# Patient Record
Sex: Female | Born: 1965 | Race: Black or African American | Hispanic: No | Marital: Married | Smoking: Never smoker
Health system: Southern US, Community
[De-identification: ages and names within clinical notes are randomized; demographics above are authoritative.]

## PROBLEM LIST (undated history)

## (undated) DIAGNOSIS — I340 Nonrheumatic mitral (valve) insufficiency: Secondary | ICD-10-CM

## (undated) DIAGNOSIS — R011 Cardiac murmur, unspecified: Secondary | ICD-10-CM

## (undated) DIAGNOSIS — R002 Palpitations: Secondary | ICD-10-CM

## (undated) HISTORY — DX: Nonrheumatic mitral (valve) insufficiency: I34.0

## (undated) HISTORY — PX: CHOLECYSTECTOMY: SHX55

## (undated) HISTORY — DX: Cardiac murmur, unspecified: R01.1

## (undated) HISTORY — PX: GALLBLADDER SURGERY: SHX652

## (undated) HISTORY — PX: VAGINAL HYSTERECTOMY: SUR661

## (undated) HISTORY — DX: Palpitations: R00.2

---

## 2006-05-05 ENCOUNTER — Emergency Department: Payer: Self-pay

## 2007-03-30 ENCOUNTER — Inpatient Hospital Stay: Payer: Self-pay | Admitting: Internal Medicine

## 2007-03-30 ENCOUNTER — Other Ambulatory Visit: Payer: Self-pay

## 2007-06-11 ENCOUNTER — Encounter: Admission: RE | Admit: 2007-06-11 | Discharge: 2007-06-11 | Payer: Self-pay | Admitting: Specialist

## 2007-11-21 ENCOUNTER — Ambulatory Visit: Payer: Self-pay | Admitting: Obstetrics and Gynecology

## 2008-05-08 ENCOUNTER — Emergency Department: Payer: Self-pay | Admitting: Emergency Medicine

## 2008-12-01 ENCOUNTER — Ambulatory Visit: Payer: Self-pay | Admitting: Obstetrics and Gynecology

## 2008-12-06 ENCOUNTER — Emergency Department: Payer: Self-pay | Admitting: Emergency Medicine

## 2009-01-30 HISTORY — PX: GASTRIC BYPASS: SHX52

## 2009-08-12 ENCOUNTER — Ambulatory Visit: Payer: Self-pay

## 2009-09-01 ENCOUNTER — Ambulatory Visit: Payer: Self-pay | Admitting: Bariatrics

## 2009-09-08 ENCOUNTER — Ambulatory Visit: Payer: Self-pay | Admitting: Bariatrics

## 2009-09-30 ENCOUNTER — Ambulatory Visit: Payer: Self-pay | Admitting: Bariatrics

## 2011-08-31 ENCOUNTER — Ambulatory Visit: Payer: Self-pay | Admitting: Internal Medicine

## 2011-08-31 DIAGNOSIS — I340 Nonrheumatic mitral (valve) insufficiency: Secondary | ICD-10-CM

## 2011-08-31 HISTORY — DX: Nonrheumatic mitral (valve) insufficiency: I34.0

## 2011-09-04 ENCOUNTER — Ambulatory Visit: Payer: Self-pay | Admitting: Internal Medicine

## 2011-09-20 ENCOUNTER — Ambulatory Visit: Payer: Self-pay | Admitting: Internal Medicine

## 2011-09-27 ENCOUNTER — Ambulatory Visit: Payer: Self-pay | Admitting: Internal Medicine

## 2011-10-18 ENCOUNTER — Encounter: Payer: Self-pay | Admitting: *Deleted

## 2011-10-27 ENCOUNTER — Ambulatory Visit (INDEPENDENT_AMBULATORY_CARE_PROVIDER_SITE_OTHER): Payer: 59 | Admitting: Cardiovascular Disease

## 2011-10-27 ENCOUNTER — Encounter: Payer: Self-pay | Admitting: Cardiovascular Disease

## 2011-10-27 VITALS — BP 124/82 | HR 68 | Ht 67.0 in | Wt 170.0 lb

## 2011-10-27 DIAGNOSIS — R002 Palpitations: Secondary | ICD-10-CM

## 2011-10-27 DIAGNOSIS — R0602 Shortness of breath: Secondary | ICD-10-CM

## 2011-10-27 DIAGNOSIS — R42 Dizziness and giddiness: Secondary | ICD-10-CM

## 2011-10-27 NOTE — Progress Notes (Signed)
   Patient ID: Tiffany Bishop, female    DOB: 01-16-1966, 46 y.o.   MRN: [id number]  HPI Comments: Tiffany Bishop is a pleasant 46 your old woman, patient of Dr. Alison Murray, with a history of obesity, gastric bypass surgery with significant weight loss, presenting for evaluation of chronic dizziness.  She reports that  she has been in good health and does power walking sometimes twice a day. She denies any shortness of breath or chest pain. She has dizziness that comes on lasting for seconds up to 1 minute. They happen either at rest or with exertion. It can happen when she is sitting down and sometimes lying down makes it better. Typically she has to stop what she is doing and sit until it passes. Since the initial onset, previously associated sometimes with headaches, her symptoms have been slowly getting better though have not resolved.  Echocardiogram was done that showed normal LV function, mild mitral valve regurgitation (mild to moderate in the details though mild MR and the summary ). She had a Holter monitor that showed normal sinus rhythm with APCs, PVCs Diary did not show arrhythmia   EKG today shows normal sinus rhythm with rate 68 beats per minute with no significant ST or T wave changes poor R wave progression to the anterior precordial leads .    Outpatient Encounter Prescriptions as of 10/27/2011  Medication Sig Dispense Refill  . meloxicam (MOBIC) 15 MG tablet Take 15 mg by mouth daily.      . Multiple Vitamin (MULTIVITAMIN) capsule Take 1 capsule by mouth daily.        Review of Systems  Constitutional: Negative.   HENT: Negative.   Eyes: Negative.   Respiratory: Negative.   Cardiovascular: Negative.   Gastrointestinal: Negative.   Musculoskeletal: Negative.   Skin: Negative.   Neurological: Positive for dizziness.  Hematological: Negative.   Psychiatric/Behavioral: Negative.   All other systems reviewed and are negative.    BP 124/82  Pulse 68  Ht 5\' 7"   (1.702 m)  Wt 170 lb (77.111 kg)  BMI 26.63 kg/m2  Physical Exam  Nursing note and vitals reviewed. Constitutional: She is oriented to person, place, and time. She appears well-developed and well-nourished.  HENT:  Head: Normocephalic.  Nose: Nose normal.  Mouth/Throat: Oropharynx is clear and moist.  Eyes: Conjunctivae normal are normal. Pupils are equal, round, and reactive to light.  Neck: Normal range of motion. Neck supple. No JVD present.  Cardiovascular: Normal rate, regular rhythm, S1 normal, S2 normal, normal heart sounds and intact distal pulses.  Exam reveals no gallop and no friction rub.   No murmur heard. Pulmonary/Chest: Effort normal and breath sounds normal. No respiratory distress. She has no wheezes. She has no rales. She exhibits no tenderness.  Abdominal: Soft. Bowel sounds are normal. She exhibits no distension. There is no tenderness.  Musculoskeletal: Normal range of motion. She exhibits no edema and no tenderness.  Lymphadenopathy:    She has no cervical adenopathy.  Neurological: She is alert and oriented to person, place, and time. Coordination normal.  Skin: Skin is warm and dry. No rash noted. No erythema.  Psychiatric: She has a normal mood and affect. Her behavior is normal. Judgment and thought content normal.         Assessment and Plan

## 2011-10-27 NOTE — Patient Instructions (Addendum)
You are doing well. No medication changes were made.  We will order a 48 hour monitor. This can be continued for up to 30 days  Please call us if you have new issues that need to be addressed before your next appt.

## 2011-10-27 NOTE — Assessment & Plan Note (Addendum)
Etiology of her symptoms is not clear. Testing so far has been normal including echocardiogram and 24-hour Holter. She is concerned about cardiac rhythm issues. We have suggested she repeat a Holter with 48-hour monitoring. If she is unable to capture her symptoms while wearing a monitor, 30 day monitor could be arranged.  If this is normal in the setting of symptoms (and we have suggested she keep a diary), no further cardiac workup would be needed at this time. Symptoms come on at rest, not associated with exertion and less likely ischemia. At that point, she may benefit from neurology or ear nose throat for possible vestibular issues.

## 2011-11-06 DIAGNOSIS — R002 Palpitations: Secondary | ICD-10-CM

## 2011-12-21 ENCOUNTER — Telehealth: Payer: Self-pay | Admitting: Cardiovascular Disease

## 2011-12-21 NOTE — Telephone Encounter (Signed)
Pt calling states she never got results from [facility].

## 2011-12-22 NOTE — Telephone Encounter (Signed)
Pt informed Understanding verb 

## 2011-12-22 NOTE — Telephone Encounter (Signed)
Placed results in dr. Windell Hummingbird box

## 2011-12-22 NOTE — Telephone Encounter (Signed)
Essentially normal monitor No significant arrhythmia noted Heart rate is in a reasonable range on all days tested No significant ectopy

## 2012-01-01 DIAGNOSIS — R002 Palpitations: Secondary | ICD-10-CM

## 2012-01-04 ENCOUNTER — Other Ambulatory Visit: Payer: Self-pay | Admitting: Cardiovascular Disease

## 2012-01-04 DIAGNOSIS — R002 Palpitations: Secondary | ICD-10-CM

## 2012-01-04 DIAGNOSIS — R42 Dizziness and giddiness: Secondary | ICD-10-CM

## 2012-01-18 ENCOUNTER — Ambulatory Visit: Payer: Self-pay | Admitting: Specialist

## 2012-01-26 ENCOUNTER — Ambulatory Visit: Payer: Self-pay | Admitting: Specialist

## 2012-03-16 ENCOUNTER — Other Ambulatory Visit: Payer: Self-pay

## 2012-10-11 ENCOUNTER — Ambulatory Visit: Payer: 59 | Admitting: Adult Health

## 2012-12-05 ENCOUNTER — Other Ambulatory Visit: Payer: Self-pay

## 2013-03-26 ENCOUNTER — Ambulatory Visit: Payer: Self-pay | Admitting: Podiatry

## 2013-08-19 ENCOUNTER — Encounter (HOSPITAL_COMMUNITY): Payer: Self-pay | Admitting: Emergency Medicine

## 2013-08-19 ENCOUNTER — Emergency Department (HOSPITAL_COMMUNITY): Payer: 59

## 2013-08-19 ENCOUNTER — Emergency Department (HOSPITAL_COMMUNITY)
Admission: EM | Admit: 2013-08-19 | Discharge: 2013-08-19 | Disposition: A | Discharge status: Home / Self Care | Payer: 59 | Attending: Emergency Medicine | Admitting: Emergency Medicine

## 2013-08-19 DIAGNOSIS — R011 Cardiac murmur, unspecified: Secondary | ICD-10-CM | POA: Insufficient documentation

## 2013-08-19 DIAGNOSIS — R071 Chest pain on breathing: Secondary | ICD-10-CM | POA: Insufficient documentation

## 2013-08-19 DIAGNOSIS — R0789 Other chest pain: Secondary | ICD-10-CM

## 2013-08-19 LAB — COMPREHENSIVE METABOLIC PANEL
ALT: 64 U/L — ABNORMAL HIGH (ref 0–35)
AST: 33 U/L (ref 0–37)
Albumin: 3.7 g/dL (ref 3.5–5.2)
Alkaline Phosphatase: 73 U/L (ref 39–117)
Anion gap: 13 (ref 5–15)
BILIRUBIN TOTAL: 0.3 mg/dL (ref 0.3–1.2)
BUN: 9 mg/dL (ref 6–23)
CALCIUM: 8.9 mg/dL (ref 8.4–10.5)
CHLORIDE: 103 meq/L (ref 96–112)
CO2: 23 meq/L (ref 19–32)
Creatinine, Ser: 0.6 mg/dL (ref 0.50–1.10)
GFR calc Af Amer: >90 mL/min (ref >90)
GFR calc non Af Amer: >90 mL/min (ref >90)
Glucose, Bld: 83 mg/dL (ref 70–99)
POTASSIUM: 4.3 meq/L (ref 3.7–5.3)
Sodium: 139 mEq/L (ref 137–147)
Total Protein: 6.7 g/dL (ref 6.0–8.3)

## 2013-08-19 LAB — TROPONIN I

## 2013-08-19 LAB — CBC
HEMATOCRIT: 36.3 % (ref 36.0–46.0)
HEMOGLOBIN: 12 g/dL (ref 12.0–15.0)
MCH: 28 pg (ref 26.0–34.0)
MCHC: 33.1 g/dL (ref 30.0–36.0)
MCV: 84.6 fL (ref 78.0–100.0)
Platelets: 255 10*3/uL (ref 150–400)
RBC: 4.29 MIL/uL (ref 3.87–5.11)
RDW: 13.3 % (ref 11.5–15.5)
WBC: 5.5 10*3/uL (ref 4.0–10.5)

## 2013-08-19 LAB — I-STAT TROPONIN, ED: Troponin i, poc: 0 ng/mL (ref 0.00–0.08)

## 2013-08-19 LAB — D-DIMER, QUANTITATIVE (NOT AT ARMC)

## 2013-08-19 LAB — LIPASE, BLOOD: Lipase: 18 U/L (ref 11–59)

## 2013-08-19 MED ORDER — CYCLOBENZAPRINE HCL 5 MG PO TABS: 5.0000 mg | ORAL_TABLET | Freq: Three times a day (TID) | ORAL | Status: DC | PRN

## 2013-08-19 MED ORDER — KETOROLAC TROMETHAMINE 30 MG/ML IJ SOLN
30.0000 mg | Freq: Once | INTRAMUSCULAR | Status: AC
  Administered 2013-08-19: 30 mg via INTRAVENOUS
  Filled 2013-08-19: qty 1

## 2013-08-19 MED ORDER — NAPROXEN 500 MG PO TABS: 500.0000 mg | ORAL_TABLET | Freq: Two times a day (BID) | ORAL | Status: DC

## 2013-08-19 MED ORDER — DIAZEPAM 5 MG/ML IJ SOLN
5.0000 mg | Freq: Once | INTRAMUSCULAR | Status: AC
  Administered 2013-08-19: 5 mg via INTRAVENOUS
  Filled 2013-08-19: qty 2

## 2013-08-19 MED ORDER — ACETAMINOPHEN 500 MG PO TABS
1000.0000 mg | ORAL_TABLET | Freq: Once | ORAL | Status: AC
  Administered 2013-08-19: 1000 mg via ORAL
  Filled 2013-08-19: qty 2

## 2013-08-19 NOTE — ED Notes (Signed)
Saa, Phlebotomy at the bedside.  

## 2013-08-19 NOTE — ED Notes (Signed)
To [facility] via w/c

## 2013-08-19 NOTE — ED Notes (Signed)
Per [facility]: Patient began having symptoms this AM at work. Patient states she was cold, diaphoretic, and having centralized non radiating chest pain. Pain on a scale of 8 out of 10. Denies N/V. Patient experienced some SOB. Clear lung sounds, denies significant medical history, allergies, or current medications. EMS administered 1 NTG, decreased pain from 8 to 5. 324 mg ASA. Placed on O2. 20 g to L AC. VSS.

## 2013-08-19 NOTE — Discharge Instructions (Signed)
Drink plenty of fluids. Take the prescribed medications for your chest wall soreness. Use ice and heat for the pain. Recheck if you get a fever, cough, struggle to breathe or seem worse.

## 2013-08-19 NOTE — ED Provider Notes (Signed)
CSN: [id number]     Arrival date & time 08/19/13  1005 History   First MD Initiated Contact with Patient 08/19/13 1037     Chief Complaint  Patient presents with  . 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 She states it is a very stressful job. She states today she felt fine when she went to work. About 8:15 she started having chest pain. She took one Excedrin however her chest started getting tighter and she started feeling short of breath. She then got very sweaty and started feeling hot. She had nausea without vomiting. She states about 8:20 she had a normal bowel movement however this did not improve her discomfort. She states her chest is sore to touch. She states taking a deep breath or moving her arms makes the pain worse. She states resting makes it feel better. She states she's never had this pain before. She denies any change in her activity. Patient states her pain currently as a 6/10, at its worst it was an 8/10. EMS gave her aspirin and a nitroglycerin. It did seem to temporarily improve her pain.  Family history patient states her father who is [id number] has an enlarged heart otherwise negative for coronary artery disease.  PCP Dr Lyndel PleasureErica Howard in [place]  Past Medical History  Diagnosis Date  . Mitral regurgitation 08/2011    mild to moderate from an Echo  . Heart murmur   . Palpitation    Past Surgical History  Procedure Laterality Date  . Gastric bypass  2011  . Vaginal hysterectomy    . Gallbladder surgery    . Cholecystectomy     Family History  Problem Relation Age of Onset  . Heart failure Father   . Hyperlipidemia Father   . Hypertension Father    History  Substance Use Topics  . Smoking status: Never Smoker   . Smokeless tobacco: Not on file  . Alcohol Use: No   Employed at [employer] in call center   OB History   Grav Para  Term Preterm Abortions TAB SAB Ect Mult Living                 Review of Systems  All other systems reviewed and are negative.     Allergies  Review of patient's allergies indicates no known allergies.  Home Medications   Prior to Admission medications   Medication Sig Start Date End Date Taking? Authorizing Provider  Multiple Vitamin (MULTIVITAMIN) capsule Take 1 capsule by mouth daily.   Yes Historical Provider, MD   BP 109/64  Pulse 66  Temp(Src) 98.1 F (36.7 C) (Oral)  Resp 15  Ht 5\' 6"  (1.676 m)  Wt 178 lb (80.74 kg)  BMI 28.74 kg/m2  SpO2 100%  Vital signs normal   Physical Exam  Nursing note and vitals reviewed. Constitutional: She is oriented to person, place, and time. She appears well-developed and well-nourished.  Non-toxic appearance. She does not appear ill. No distress.  HENT:  Head: Normocephalic and atraumatic.  Right Ear: External ear normal.  Left Ear: External ear normal.  Nose: Nose normal. No mucosal edema or rhinorrhea.  Mouth/Throat: Oropharynx is clear and moist and mucous membranes are normal. No dental abscesses or uvula swelling.  Eyes: Conjunctivae and EOM are normal. Pupils are equal, round, and reactive to light.  Neck: Normal range of motion and full passive range of motion without  pain. Neck supple.  Cardiovascular: Normal rate, regular rhythm and normal heart sounds.  Exam reveals no gallop and no friction rub.   No murmur heard. Pulmonary/Chest: Effort normal and breath sounds normal. No respiratory distress. She has no wheezes. She has no rhonchi. She has no rales. She exhibits tenderness. She exhibits no crepitus.    Patient has diffuse chest wall tenderness of her chest.  Abdominal: Soft. Normal appearance and bowel sounds are normal. She exhibits no distension. There is no tenderness. There is no rebound and no guarding.  Musculoskeletal: Normal range of motion. She exhibits no edema and no tenderness.  Moves all extremities  well.   Neurological: She is alert and oriented to person, place, and time. She has normal strength. No cranial nerve deficit.  Skin: Skin is warm, dry and intact. No rash noted. No erythema. No pallor.  Psychiatric: She has a normal mood and affect. Her speech is normal and behavior is normal. Her mood appears not anxious.    ED Course  Procedures (including critical care time)  Medications  ketorolac (TORADOL) 30 MG/ML injection 30 mg (30 mg Intravenous Given 08/19/13 1131)  diazepam (VALIUM) injection 5 mg (5 mg Intravenous Given 08/19/13 1132)  acetaminophen (TYLENOL) tablet 1,000 mg (1,000 mg Oral Given 08/19/13 1331)   Recheck at 1320 patient states her chest wall pain is improved. She is not able to breathe deeply and move her arms without pain. She however states she has a headache from the nitroglycerin given to her by EMS. We've reviewed all her lab test results. We discussed her following up with her PCP about the mild elevation of her SGPT. She is not on any medications such as a anti-cholesterol medicine that could be doing this. We discussed getting a second troponin and hopefully if it is normal she will be discharged.  14:50 Second troponin is negative, pt is being discharged with chest wall pain.    Labs Review Results for orders placed during the hospital encounter of 08/19/13  CBC      Result Value Ref Range   WBC 5.5  4.0 - 10.5 K/uL   RBC 4.29  3.87 - 5.11 MIL/uL   Hemoglobin 12.0  12.0 - 15.0 g/dL   HCT 16.1  09.6 - 04.5 %   MCV 84.6  78.0 - 100.0 fL   MCH 28.0  26.0 - 34.0 pg   MCHC 33.1  30.0 - 36.0 g/dL   RDW 40.9  81.1 - 91.4 %   Platelets 255  150 - 400 K/uL  TROPONIN I      Result Value Ref Range   Troponin I <0.30  <0.30 ng/mL  COMPREHENSIVE METABOLIC PANEL      Result Value Ref Range   Sodium 139  137 - 147 mEq/L   Potassium 4.3  3.7 - 5.3 mEq/L   Chloride 103  96 - 112 mEq/L   CO2 23  19 - 32 mEq/L   Glucose, Bld 83  70 - 99 mg/dL   BUN 9  6 - 23  mg/dL   Creatinine, Ser 7.82  0.50 - 1.10 mg/dL   Calcium 8.9  8.4 - 95.6 mg/dL   Total Protein 6.7  6.0 - 8.3 g/dL   Albumin 3.7  3.5 - 5.2 g/dL   AST 33  0 - 37 U/L   ALT 64 (*) 0 - 35 U/L   Alkaline Phosphatase 73  39 - 117 U/L   Total Bilirubin 0.3  0.3 -  1.2 mg/dL   GFR calc non Af Amer >90  >90 mL/min   GFR calc Af Amer >90  >90 mL/min   Anion gap 13  5 - 15  LIPASE, BLOOD      Result Value Ref Range   Lipase 18  11 - 59 U/L  D-DIMER, QUANTITATIVE      Result Value Ref Range   D-Dimer, Quant <0.27  0.00 - 0.48 ug/mL-FEU  I-STAT TROPOININ, ED      Result Value Ref Range   Troponin i, poc 0.00  0.00 - 0.08 ng/mL   Comment 3            Laboratory interpretation all normal except elevation of the SGPT    Imaging Review Dg Chest 2 View  08/19/2013   CLINICAL DATA:  Shortness of breath, chest pain  EXAM: CHEST  2 VIEW  COMPARISON:  None.  FINDINGS: The heart size and mediastinal contours are within normal limits. Both lungs are clear. The visualized skeletal structures are unremarkable.  IMPRESSION: No active cardiopulmonary disease.   Electronically Signed   By: Elige Ko   On: 08/19/2013 11:10     EKG Interpretation   Date/Time:  Tuesday August 19 2013 10:12:48 EDT Ventricular Rate:  61 PR Interval:  139 QRS Duration: 99 QT Interval:  399 QTC Calculation: 402 R Axis:   -36 Text Interpretation:  Sinus rhythm Left axis deviation Low voltage,  extremity and precordial leads RSR' in V1 or V2, probably normal variant  Consider anterior infarct No old tracing to compare Confirmed by Faatima Tench   MD-I, Taletha Twiford ([id number]) on 08/19/2013 10:33:31 AM      MDM   Final diagnoses:  Chest wall pain   New Prescriptions   CYCLOBENZAPRINE (FLEXERIL) 5 MG TABLET    Take 1 tablet (5 mg total) by mouth 3 (three) times daily as needed (muscle soreness).   NAPROXEN (NAPROSYN) 500 MG TABLET    Take 1 tablet (500 mg total) by mouth 2 (two) times daily.    Plan discharge   Devoria Albe, MD,  Franz Dell, MD 08/19/13 [phone]

## 2013-08-19 NOTE — ED Notes (Signed)
Dr. Knapp at the bedside.  

## 2014-09-20 ENCOUNTER — Encounter: Payer: Self-pay | Admitting: Emergency Medicine

## 2014-09-20 ENCOUNTER — Emergency Department
Admission: EM | Admit: 2014-09-20 | Discharge: 2014-09-20 | Disposition: A | Discharge status: Home / Self Care | Payer: BC Managed Care – PPO | Attending: Emergency Medicine | Admitting: Emergency Medicine

## 2014-09-20 ENCOUNTER — Emergency Department: Payer: BC Managed Care – PPO

## 2014-09-20 DIAGNOSIS — Y9389 Activity, other specified: Secondary | ICD-10-CM | POA: Insufficient documentation

## 2014-09-20 DIAGNOSIS — Y998 Other external cause status: Secondary | ICD-10-CM | POA: Diagnosis not present

## 2014-09-20 DIAGNOSIS — S060X0A Concussion without loss of consciousness, initial encounter: Secondary | ICD-10-CM | POA: Insufficient documentation

## 2014-09-20 DIAGNOSIS — S0083XA Contusion of other part of head, initial encounter: Secondary | ICD-10-CM | POA: Insufficient documentation

## 2014-09-20 DIAGNOSIS — Y92410 Unspecified street and highway as the place of occurrence of the external cause: Secondary | ICD-10-CM | POA: Diagnosis not present

## 2014-09-20 DIAGNOSIS — F0781 Postconcussional syndrome: Secondary | ICD-10-CM

## 2014-09-20 DIAGNOSIS — S0990XA Unspecified injury of head, initial encounter: Secondary | ICD-10-CM | POA: Diagnosis present

## 2014-09-20 MED ORDER — HYDROCODONE-ACETAMINOPHEN 5-325 MG PO TABS
1.0000 | ORAL_TABLET | Freq: Once | ORAL | Status: AC
  Administered 2014-09-20: 1 via ORAL
  Filled 2014-09-20: qty 1

## 2014-09-20 MED ORDER — HYDROCODONE-ACETAMINOPHEN 5-325 MG PO TABS: 1.0000 | ORAL_TABLET | ORAL | Status: DC | PRN

## 2014-09-20 NOTE — Discharge Instructions (Signed)
Concussion °A concussion is a brain injury. It is caused by: °· A hit to the head. °· A quick and sudden movement (jolt) of the head or neck. °A concussion is usually not life threatening. Even so, it can cause serious problems. If you had a concussion before, you may have concussion-like problems after a hit to your head. °HOME CARE °General Instructions °· Follow your doctor's directions carefully. °· Take medicines only as told by your doctor. °· Only take medicines your doctor says are safe. °· Do not drink alcohol until your doctor says it is okay. Alcohol and some drugs can slow down healing. They can also put you at risk for further injury. °· If you are having trouble remembering things, write them down. °· Try to do one thing at a time if you get distracted easily. For example, do not watch TV while making dinner. °· Talk to your family members or close friends when making important decisions. °· Follow up with your doctor as told. °· Watch your symptoms. Tell others to do the same. Serious problems can sometimes happen after a concussion. Older adults are more likely to have these problems. °· Tell your teachers, school nurse, school counselor, coach, athletic trainer, or work manager about your concussion. Tell them about what you can or cannot do. They should watch to see if: °¨ It gets even harder for you to pay attention or concentrate. °¨ It gets even harder for you to remember things or learn new things. °¨ You need more time than normal to finish things. °¨ You become annoyed (irritable) more than before. °¨ You are not able to deal with stress as well. °¨ You have more problems than before. °· Rest. Make sure you: °¨ Get plenty of sleep at night. °¨ Go to sleep early. °¨ Go to bed at the same time every day. Try to wake up at the same time. °¨ Rest during the day. °¨ Take naps when you feel tired. °· Limit activities where you have to think a lot or concentrate. These include: °¨ Doing  homework. °¨ Doing work related to a job. °¨ Watching TV. °¨ Using the computer. °Returning To Your Regular Activities °Return to your normal activities slowly, not all at once. You must give your body and brain enough time to heal.  °· Do not play sports or do other athletic activities until your doctor says it is okay. °· Ask your doctor when you can drive, ride a bicycle, or work other vehicles or machines. Never do these things if you feel dizzy. °· Ask your doctor about when you can return to work or school. °Preventing Another Concussion °It is very important to avoid another brain injury, especially before you have healed. In rare cases, another injury can lead to permanent brain damage, brain swelling, or death. The risk of this is greatest during the first 7-10 days after your injury. Avoid injuries by:  °· Wearing a seat belt when riding in a car. °· Not drinking too much alcohol. °· Avoiding activities that could lead to a second concussion (such as contact sports). °· Wearing a helmet when doing activities like: °¨ Biking. °¨ Skiing. °¨ Skateboarding. °¨ Skating. °· Making your home safer by: °¨ Removing things from the floor or stairways that could make you trip. °¨ Using grab bars in bathrooms and handrails by stairs. °¨ Placing non-slip mats on floors and in bathtubs. °¨ Improve lighting in dark areas. °GET HELP IF: °· It   gets even harder for you to pay attention or concentrate.  It gets even harder for you to remember things or learn new things.  You need more time than normal to finish things.  You become annoyed (irritable) more than before.  You are not able to deal with stress as well.  You have more problems than before.  You have problems keeping your balance.  You are not able to react quickly when you should. Get help if you have any of these problems for more than 2 weeks:   Lasting (chronic) headaches.  Dizziness or trouble balancing.  Feeling sick to your stomach  (nausea).  Seeing (vision) problems.  Being affected by noises or light more than normal.  Feeling sad, low, down in the dumps, blue, gloomy, or empty (depressed).  Mood changes (mood swings).  Feeling of fear or nervousness about what may happen (anxiety).  Feeling annoyed.  Memory problems.  Problems concentrating or paying attention.  Sleep problems.  Feeling tired all the time. GET HELP RIGHT AWAY IF:   You have bad headaches or your headaches get worse.  You have weakness (even if it is in one hand, leg, or part of the face).  You have loss of feeling (numbness).  You feel off balance.  You keep throwing up (vomiting).  You feel tired.  One black center of your eye (pupil) is larger than the other.  You twitch or shake violently (convulse).  Your speech is not clear (slurred).  You are more confused, easily angered (agitated), or annoyed than before.  You have more trouble resting than before.  You are unable to recognize people or places.  You have neck pain.  It is difficult to wake you up.  You have unusual behavior changes.  You pass out (lose consciousness). MAKE SURE YOU:   Understand these instructions.  Will watch your condition.  Will get help right away if you are not doing well or get worse. Document Released: 01/04/2009 Document Revised: 06/02/2013 Document Reviewed: 08/08/2012 [facility] Patient Information 2015 [place], [place]. This information is not intended to replace advice given to you by your health care provider. Make sure you discuss any questions you have with your health care provider.    FOLLOW UP WITH YOUR DOCTOR AT [facility] ABOUT THE 3 CM MENINGIOMA THAT WE DISCUSSED.  THIS IS NOT A RESULT OF YOUR ACCIDENT [facility] FOR PAIN AS NEEDED ICE TO FOREHEAD AS NEEDED FOR PAIN AND SWELLING

## 2014-09-20 NOTE — ED Notes (Signed)
AAOx3.  Skin warm and dry.  Moving all extremities equally and strong.  Ambulates with easy and steady gait.  NAD 
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
## 2015-09-13 ENCOUNTER — Encounter: Payer: Self-pay | Admitting: Emergency Medicine

## 2015-09-13 ENCOUNTER — Emergency Department
Admission: EM | Admit: 2015-09-13 | Discharge: 2015-09-13 | Disposition: A | Discharge status: Home / Self Care | Payer: BC Managed Care – PPO | Attending: Emergency Medicine | Admitting: Emergency Medicine

## 2015-09-13 ENCOUNTER — Emergency Department: Payer: BC Managed Care – PPO

## 2015-09-13 DIAGNOSIS — R0789 Other chest pain: Secondary | ICD-10-CM | POA: Insufficient documentation

## 2015-09-13 DIAGNOSIS — R0602 Shortness of breath: Secondary | ICD-10-CM | POA: Diagnosis present

## 2015-09-13 DIAGNOSIS — R079 Chest pain, unspecified: Secondary | ICD-10-CM

## 2015-09-13 LAB — COMPREHENSIVE METABOLIC PANEL
ALBUMIN: 3.6 g/dL (ref 3.5–5.0)
ALK PHOS: 62 U/L (ref 38–126)
ALT: 21 U/L (ref 14–54)
ANION GAP: 6 (ref 5–15)
AST: 27 U/L (ref 15–41)
BILIRUBIN TOTAL: 0.5 mg/dL (ref 0.3–1.2)
BUN: 10 mg/dL (ref 6–20)
CALCIUM: 8.5 mg/dL — AB (ref 8.9–10.3)
CO2: 22 mmol/L (ref 22–32)
CREATININE: 0.55 mg/dL (ref 0.44–1.00)
Chloride: 110 mmol/L (ref 101–111)
GFR calc Af Amer: >60 mL/min (ref >60)
GFR calc non Af Amer: >60 mL/min (ref >60)
GLUCOSE: 110 mg/dL — AB (ref 65–99)
Potassium: 3.8 mmol/L (ref 3.5–5.1)
SODIUM: 138 mmol/L (ref 135–145)
TOTAL PROTEIN: 6.5 g/dL (ref 6.5–8.1)

## 2015-09-13 LAB — CBC
HEMATOCRIT: 38.3 % (ref 35.0–47.0)
HEMOGLOBIN: 13 g/dL (ref 12.0–16.0)
MCH: 28.6 pg (ref 26.0–34.0)
MCHC: 33.9 g/dL (ref 32.0–36.0)
MCV: 84.4 fL (ref 80.0–100.0)
Platelets: 258 10*3/uL (ref 150–440)
RBC: 4.54 MIL/uL (ref 3.80–5.20)
RDW: 14.2 % (ref 11.5–14.5)
WBC: 4.7 10*3/uL (ref 3.6–11.0)

## 2015-09-13 LAB — TROPONIN I: Troponin I: <0.03 ng/mL (ref <0.03)

## 2015-09-13 MED ORDER — IBUPROFEN 800 MG PO TABS
800.0000 mg | ORAL_TABLET | Freq: Once | ORAL | Status: AC
  Administered 2015-09-13: 800 mg via ORAL
  Filled 2015-09-13: qty 1

## 2015-09-13 MED ORDER — DIAZEPAM 5 MG PO TABS
5.0000 mg | ORAL_TABLET | Freq: Once | ORAL | Status: AC
  Administered 2015-09-13: 5 mg via ORAL
  Filled 2015-09-13: qty 1

## 2015-09-13 MED ORDER — LORAZEPAM 0.5 MG PO TABS: 0.5000 mg | ORAL_TABLET | Freq: Three times a day (TID) | ORAL | 0 refills | Status: AC | PRN

## 2015-09-13 NOTE — ED Notes (Signed)
Pt discharged home after verbalizing understanding of discharge instructions; nad noted. 

## 2015-09-13 NOTE — ED Triage Notes (Addendum)
Patient presents to the ED with chest pain that began around 4 am this morning.  Patient states she has been under a lot of stress lately and years ago she had similar pain caused by stress.  Patient reports some shortness of breath with the chest pain.  Patient is speaking in full sentences without obvious difficulty at this time.  Skin is warm and dry.

## 2015-09-13 NOTE — ED Provider Notes (Signed)
[facility] Emergency Department Provider Note        Time seen: ----------------------------------------- 7:19 AM on 09/13/2015 -----------------------------------------    I have reviewed the triage vital signs and the nursing notes.   HISTORY  Chief Complaint Chest Pain    HPI Tiffany LeventhalMichelle R Alridge is a 50 y.o. female who presents the ER with soreness across her chestand pain that began at 4 AM this morning. Patient states she's been under a lot of stress and years ago she had similar pain caused by stress. She reports some shortness of breath with the chest pain. She denies fevers, denies recent illness, denies risk factors for ACS.   Past Medical History:  Diagnosis Date  . Heart murmur   . Mitral regurgitation 08/2011   mild to moderate from an Echo  . Palpitation     Patient Active Problem List   Diagnosis Date Noted  . Dizziness 10/27/2011    Past Surgical History:  Procedure Laterality Date  . CHOLECYSTECTOMY    . GALLBLADDER SURGERY    . GASTRIC BYPASS  2011  . VAGINAL HYSTERECTOMY      Allergies Review of patient's allergies indicates no known allergies.  Social History Social History  Substance Use Topics  . Smoking status: Never Smoker  . Smokeless tobacco: Never Used  . Alcohol use No    Review of Systems Constitutional: Negative for fever. Cardiovascular: Positive for chest pain Respiratory: Negative for shortness of breath. Gastrointestinal: Negative for abdominal pain, vomiting and diarrhea. Genitourinary: Negative for dysuria. Musculoskeletal: Negative for back pain. Skin: Negative for rash. Neurological: Negative for headaches, focal weakness or numbness.  10-point ROS otherwise negative.  ____________________________________________   PHYSICAL EXAM:  VITAL SIGNS: ED Triage Vitals [09/13/15 0717]  Enc Vitals Group     BP      Pulse      Resp      Temp      Temp src      SpO2      Weight 190 lb  (86.2 kg)     Height 5\' 7"  (1.702 m)     Head Circumference      Peak Flow      Pain Score 6     Pain Loc      Pain Edu?      Excl. in GC?     Constitutional: Alert and oriented. Well appearing and in no distress. Eyes: Conjunctivae are normal. PERRL. Normal extraocular movements. ENT   Head: Normocephalic and atraumatic.   Nose: No congestion/rhinnorhea.   Mouth/Throat: Mucous membranes are moist.   Neck: No stridor. Cardiovascular: Normal rate, regular rhythm. No murmurs, rubs, or gallops. Respiratory: Normal respiratory effort without tachypnea nor retractions. Breath sounds are clear and equal bilaterally. No wheezes/rales/rhonchi. Gastrointestinal: Soft and nontender. Normal bowel sounds Musculoskeletal: Nontender with normal range of motion in all extremities. No lower extremity tenderness nor edema. Diffuse chest wall tenderness Neurologic:  Normal speech and language. No gross focal neurologic deficits are appreciated.  Skin:  Skin is warm, dry and intact. No rash noted. Psychiatric: Mood and affect are normal. Speech and behavior are normal.  ____________________________________________  EKG: Interpreted by me. Sinus rhythm with a rate of 71 bpm, normal PR interval, normal QRS, normal QT interval. Normal axis.  ____________________________________________  ED COURSE:  Pertinent labs & imaging results that were available during my care of the patient were reviewed by me and considered in my medical decision making (see chart for details). Clinical Course  Patient is in no distress, this is likely stress or muscle strain related. We'll check basic labs and imaging.  Procedures ____________________________________________   LABS (pertinent positives/negatives)  Labs Reviewed  COMPREHENSIVE METABOLIC PANEL - Abnormal; Notable for the following:       Result Value   Glucose, Bld 110 (*)    Calcium 8.5 (*)    All other components within normal limits  CBC   TROPONIN I    RADIOLOGY Images were viewed by me  Chest x-ray Is unremarkable ____________________________________________  FINAL ASSESSMENT AND PLAN  Chest pain  Plan: Patient with labs and imaging as dictated above. Patient's heart score is low risk for ACS. She does not have any risk factors. Labs are unremarkable, I'm confident this is stress related. I will discharge her with occasional Ativan to take as needed. She is stable for outpatient follow-up.   Tiffany Bishop, [name], MD   Note: This dictation was prepared with Dragon dictation. Any transcriptional errors that result from this process are unintentional    Tiffany FilbertJonathan E Chela Sutphen, MD 09/13/15 [phone]

## 2016-10-10 IMAGING — CR DG CHEST 2V
2 series · 2 of 2 positions shown · non-contrast
Comparison: PA and lateral chest x-ray August 19, 2013

CLINICAL DATA: Onset of chest pain and some shortness of breath
this morning, under considerable stress lately; similar episode
related to stress years ago. History of moderate mitral
regurgitation, nonsmoker.

EXAM:
CHEST  2 VIEW

[chest pa]
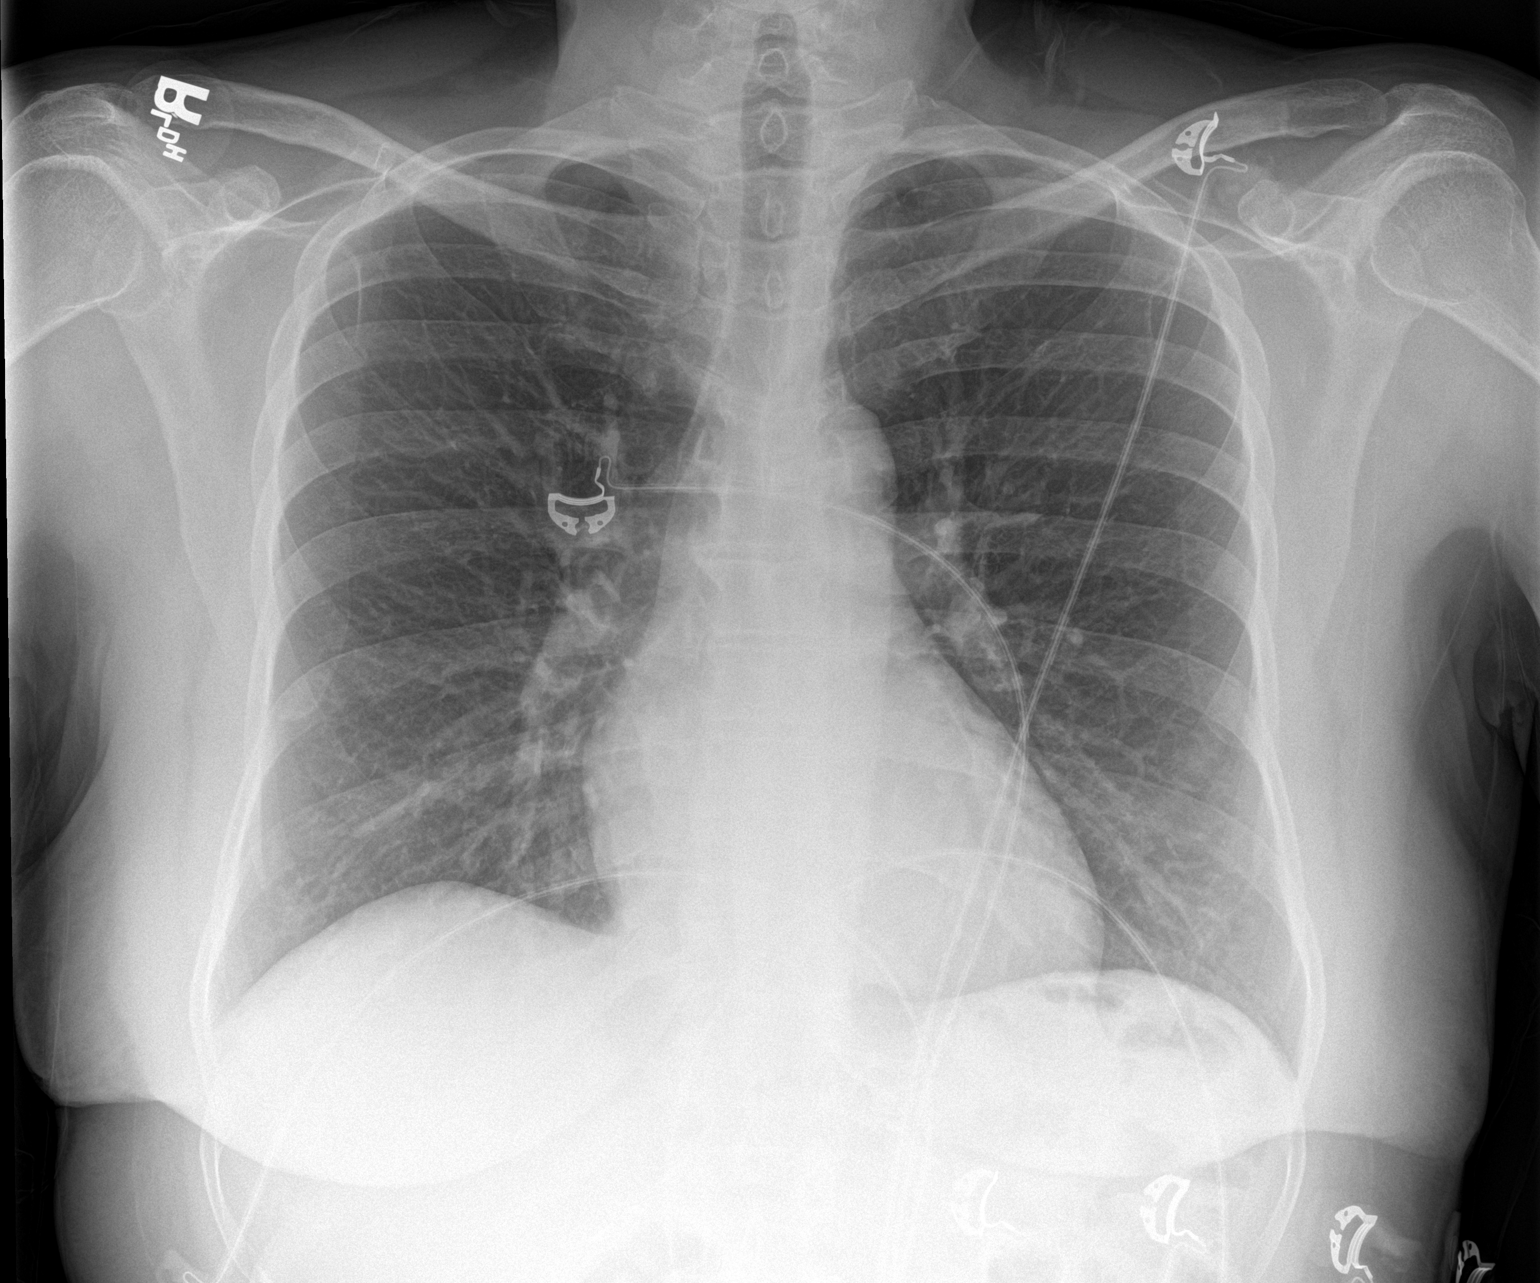

[chest lat]
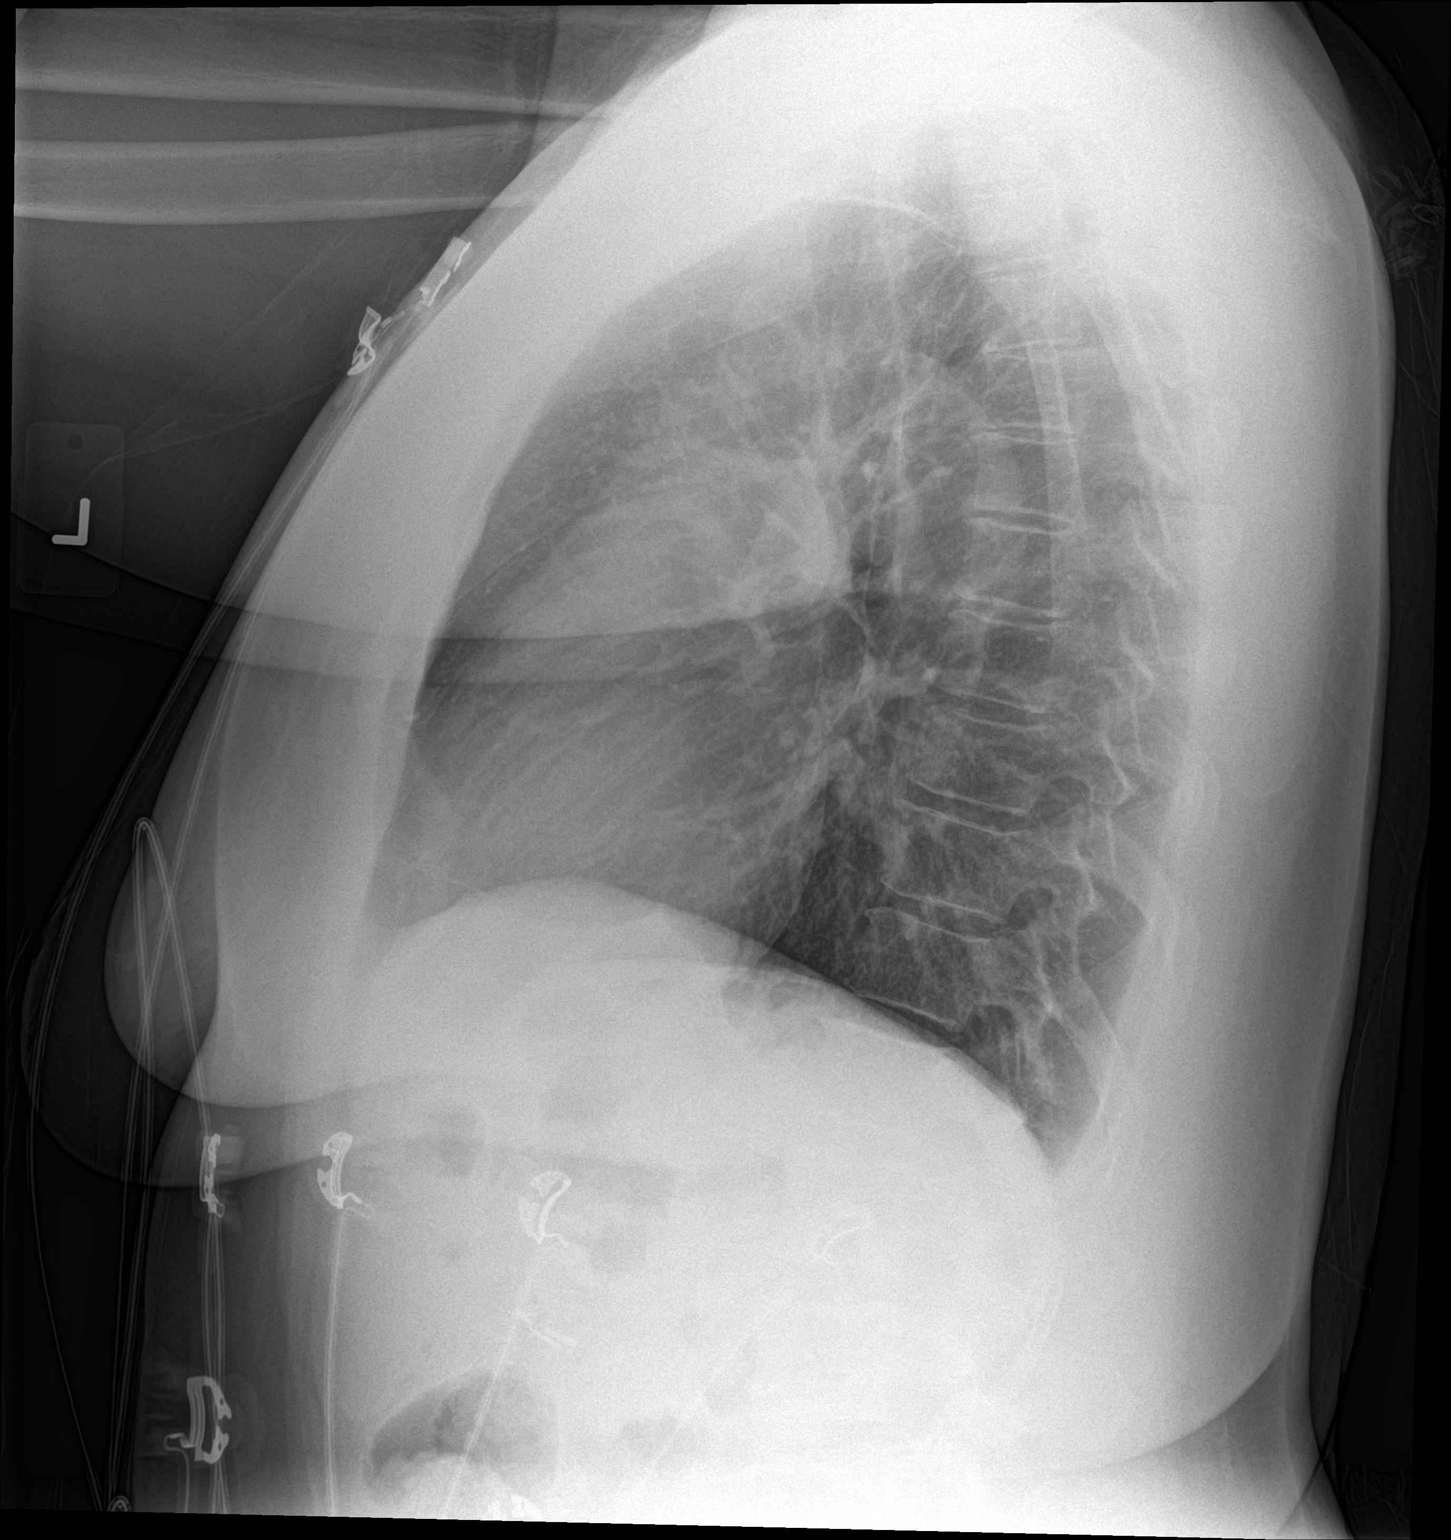

[2 of 2 positions shown; findings below may reference images not displayed]

FINDINGS: The lungs are well-expanded and clear. The heart and mediastinal
structures are normal. The pulmonary vascularity is normal. There is
no pleural effusion. The bony thorax is unremarkable.
IMPRESSION: There is no active cardiopulmonary disease.
